# Patient Record
Sex: Female | Born: 2000 | Hispanic: No | Marital: Single | Smoking: Never smoker
Health system: Southern US, Community
[De-identification: ages and names within clinical notes are randomized; demographics above are authoritative.]

## PROBLEM LIST (undated history)

## (undated) DIAGNOSIS — Z789 Other specified health status: Secondary | ICD-10-CM

## (undated) HISTORY — PX: CLEFT LIP REPAIR: SUR1164

## (undated) HISTORY — DX: Other specified health status: Z78.9

---
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
## 2024-06-04 ENCOUNTER — Emergency Department (HOSPITAL_BASED_OUTPATIENT_CLINIC_OR_DEPARTMENT_OTHER): Admission: EM | Admit: 2024-06-04 | Discharge: 2024-06-04 | Disposition: A | Discharge status: Home / Self Care

## 2024-06-04 ENCOUNTER — Encounter (HOSPITAL_BASED_OUTPATIENT_CLINIC_OR_DEPARTMENT_OTHER): Payer: Self-pay | Admitting: Urology

## 2024-06-04 DIAGNOSIS — D649 Anemia, unspecified: Secondary | ICD-10-CM | POA: Insufficient documentation

## 2024-06-04 DIAGNOSIS — R42 Dizziness and giddiness: Secondary | ICD-10-CM | POA: Diagnosis present

## 2024-06-04 DIAGNOSIS — G4700 Insomnia, unspecified: Secondary | ICD-10-CM | POA: Insufficient documentation

## 2024-06-04 LAB — BASIC METABOLIC PANEL WITH GFR
Anion gap: 14 (ref 5–15)
BUN: <5 mg/dL — ABNORMAL LOW (ref 6–20)
CO2: 22 mmol/L (ref 22–32)
Calcium: 10.1 mg/dL (ref 8.9–10.3)
Chloride: 102 mmol/L (ref 98–111)
Creatinine, Ser: 0.69 mg/dL (ref 0.44–1.00)
GFR, Estimated: >60 mL/min (ref >60)
Glucose, Bld: 114 mg/dL — ABNORMAL HIGH (ref 70–99)
Potassium: 3.5 mmol/L (ref 3.5–5.1)
Sodium: 138 mmol/L (ref 135–145)

## 2024-06-04 LAB — CBC
HCT: 33.2 % — ABNORMAL LOW (ref 36.0–46.0)
Hemoglobin: 9.5 g/dL — ABNORMAL LOW (ref 12.0–15.0)
MCH: 19 pg — ABNORMAL LOW (ref 26.0–34.0)
MCHC: 28.6 g/dL — ABNORMAL LOW (ref 30.0–36.0)
MCV: 66.4 fL — ABNORMAL LOW (ref 80.0–100.0)
Platelets: 428 K/uL — ABNORMAL HIGH (ref 150–400)
RBC: 5 MIL/uL (ref 3.87–5.11)
RDW: 17.7 % — ABNORMAL HIGH (ref 11.5–15.5)
WBC: 8.4 K/uL (ref 4.0–10.5)
nRBC: 0 % (ref 0.0–0.2)

## 2024-06-04 LAB — HCG, SERUM, QUALITATIVE: Preg, Serum: NEGATIVE

## 2024-06-04 MED ORDER — FERROUS SULFATE 90 (18 FE) MG PO TABS: 90.0000 mg | ORAL_TABLET | Freq: Every day | ORAL | 0 refills | Status: AC

## 2024-06-04 NOTE — ED Triage Notes (Signed)
 Pt states been having vaginal bleeding x 1 month  Synocopal episode x 2 weeks intermittent with feeling hot and lightheaded  Started taking Remeron for sleep 2 weeks ago  States been having worsening panic attacks  Not eating well x 6 months    Provider at bedside for [facility]

## 2024-06-04 NOTE — Discharge Instructions (Addendum)
 You are seen today for anemia and insomnia.  Your lab work did note an anemia.  With this in mind we will start you on an iron supplement, with recommend you continue to follow-up with a PCP to establish care.  I am also providing you the health department for additional resources if you are unable to find a PCP to get established with.  Recommend continue to follow up with psychiatry as well as with OB/GYN with your scheduled appointments.  For sleep you can try the 3, 2, 1 rule noting to not eat 3 hours before bed, not drink any fluids 2 hours before bed, and not have any screens 1 hour for bed.  Additionally you could also try the rules of 5 technique that I talked with you about where you breathe in and over 5 seconds, hold for 5 seconds, and breathe out for 5 seconds and do this 5 times to help with relaxing your brain and body.  If you begin to have any shortness of breath, increased bleeding, chest pain, lethargy/confusion, seizures, please return to the ED for immediate evaluation.
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
## 2024-06-04 NOTE — ED Provider Triage Note (Signed)
 Emergency Medicine Provider Triage Evaluation Note  Christine Rasmussen , a 23 y.o. female  was evaluated in triage.  Pt complains of vaginal bleeding. Started 1 month ago. Worse in the last couple of days. Also with some abdominal pain. Also says her anxiety is at an all time high.  Review of Systems  Positive: See above Negative: See above  Physical Exam  Ht 5' 2 (1.575 m)   Wt 56.7 kg   BMI 22.86 kg/m  Gen:   Awake, no distress   Resp:  Normal effort  MSK:   Moves extremities without difficulty  Other:  Work up  Medical Decision Making  Medically screening exam initiated at 2:08 PM.  Appropriate orders placed.  Christine Rasmussen was informed that the remainder of the evaluation will be completed by another provider, this initial triage assessment does not replace that evaluation, and the importance of remaining in the ED until their evaluation is complete.  Work up started   Lang Norleen POUR, PA-C 06/04/24 1410

## 2024-06-05 LAB — CBG MONITORING, ED: Glucose-Capillary: 118 mg/dL — ABNORMAL HIGH (ref 70–99)

## 2024-07-04 ENCOUNTER — Encounter (HOSPITAL_BASED_OUTPATIENT_CLINIC_OR_DEPARTMENT_OTHER): Admitting: Certified Nurse Midwife
